# Patient Record
Sex: Female | Born: 1990 | Race: Black or African American | Hispanic: No | Marital: Single | Smoking: Never smoker
Health system: Southern US, Community
[De-identification: ages and names within clinical notes are randomized; demographics above are authoritative.]

---

## 2010-01-10 ENCOUNTER — Emergency Department (HOSPITAL_COMMUNITY): Admission: EM | Admit: 2010-01-10 | Discharge: 2010-01-10 | Payer: Self-pay | Admitting: Emergency Medicine

## 2010-06-11 ENCOUNTER — Emergency Department (HOSPITAL_COMMUNITY)
Admission: EM | Admit: 2010-06-11 | Discharge: 2010-06-12 | Disposition: A | Discharge status: Home / Self Care | Payer: Self-pay | Attending: Emergency Medicine | Admitting: Emergency Medicine

## 2010-06-11 DIAGNOSIS — N898 Other specified noninflammatory disorders of vagina: Secondary | ICD-10-CM | POA: Insufficient documentation

## 2010-06-11 DIAGNOSIS — Z202 Contact with and (suspected) exposure to infections with a predominantly sexual mode of transmission: Secondary | ICD-10-CM | POA: Insufficient documentation

## 2010-06-11 DIAGNOSIS — J45909 Unspecified asthma, uncomplicated: Secondary | ICD-10-CM | POA: Insufficient documentation

## 2010-06-12 LAB — POCT PREGNANCY, URINE: Preg Test, Ur: NEGATIVE

## 2010-06-12 LAB — WET PREP, GENITAL: Yeast Wet Prep HPF POC: NONE SEEN

## 2010-06-12 LAB — GC/CHLAMYDIA PROBE AMP, GENITAL: GC Probe Amp, Genital: NEGATIVE

## 2010-08-03 ENCOUNTER — Inpatient Hospital Stay (INDEPENDENT_AMBULATORY_CARE_PROVIDER_SITE_OTHER)
Admission: RE | Admit: 2010-08-03 | Discharge: 2010-08-03 | Disposition: A | Discharge status: Home / Self Care | Payer: Self-pay | Source: Ambulatory Visit | Attending: Emergency Medicine | Admitting: Emergency Medicine

## 2010-08-03 ENCOUNTER — Ambulatory Visit (INDEPENDENT_AMBULATORY_CARE_PROVIDER_SITE_OTHER): Payer: Self-pay

## 2010-08-03 DIAGNOSIS — IMO0002 Reserved for concepts with insufficient information to code with codable children: Secondary | ICD-10-CM

## 2010-08-03 LAB — POCT PREGNANCY, URINE: Preg Test, Ur: NEGATIVE

## 2010-10-24 ENCOUNTER — Emergency Department (HOSPITAL_COMMUNITY)
Admission: EM | Admit: 2010-10-24 | Discharge: 2010-10-24 | Disposition: A | Discharge status: Home / Self Care | Payer: Self-pay | Attending: Emergency Medicine | Admitting: Emergency Medicine

## 2010-10-24 DIAGNOSIS — N72 Inflammatory disease of cervix uteri: Secondary | ICD-10-CM | POA: Insufficient documentation

## 2010-10-24 DIAGNOSIS — J45909 Unspecified asthma, uncomplicated: Secondary | ICD-10-CM | POA: Insufficient documentation

## 2010-10-24 LAB — URINALYSIS, ROUTINE W REFLEX MICROSCOPIC
Bilirubin Urine: NEGATIVE
Ketones, ur: NEGATIVE mg/dL
Leukocytes, UA: NEGATIVE
Nitrite: NEGATIVE
Specific Gravity, Urine: 1.012 (ref 1.005–1.030)
Urobilinogen, UA: 0.2 mg/dL (ref 0.0–1.0)

## 2010-10-24 LAB — POCT PREGNANCY, URINE: Preg Test, Ur: NEGATIVE

## 2010-10-25 LAB — GC/CHLAMYDIA PROBE AMP, GENITAL: Chlamydia, DNA Probe: NEGATIVE

## 2010-11-06 ENCOUNTER — Inpatient Hospital Stay (INDEPENDENT_AMBULATORY_CARE_PROVIDER_SITE_OTHER)
Admission: RE | Admit: 2010-11-06 | Discharge: 2010-11-06 | Disposition: A | Discharge status: Home / Self Care | Payer: Self-pay | Source: Ambulatory Visit | Attending: Family Medicine | Admitting: Family Medicine

## 2010-11-06 ENCOUNTER — Ambulatory Visit (INDEPENDENT_AMBULATORY_CARE_PROVIDER_SITE_OTHER): Payer: Self-pay

## 2010-11-06 DIAGNOSIS — S20229A Contusion of unspecified back wall of thorax, initial encounter: Secondary | ICD-10-CM

## 2010-11-06 DIAGNOSIS — J45909 Unspecified asthma, uncomplicated: Secondary | ICD-10-CM

## 2010-12-21 ENCOUNTER — Emergency Department (HOSPITAL_COMMUNITY)
Admission: EM | Admit: 2010-12-21 | Discharge: 2010-12-21 | Discharge status: Against Medical Advice | Payer: Self-pay | Attending: Emergency Medicine | Admitting: Emergency Medicine

## 2010-12-21 DIAGNOSIS — L02419 Cutaneous abscess of limb, unspecified: Secondary | ICD-10-CM | POA: Insufficient documentation

## 2010-12-21 DIAGNOSIS — J45909 Unspecified asthma, uncomplicated: Secondary | ICD-10-CM | POA: Insufficient documentation

## 2012-05-01 IMAGING — CR DG CHEST 2V
2 series · 2 of 2 positions shown · non-contrast
Comparison: None.

CLINICAL DATA: Cough with shortness of breath and body aches for 3
days.

CHEST - 2 VIEW

[view not recorded (1 of 2)]
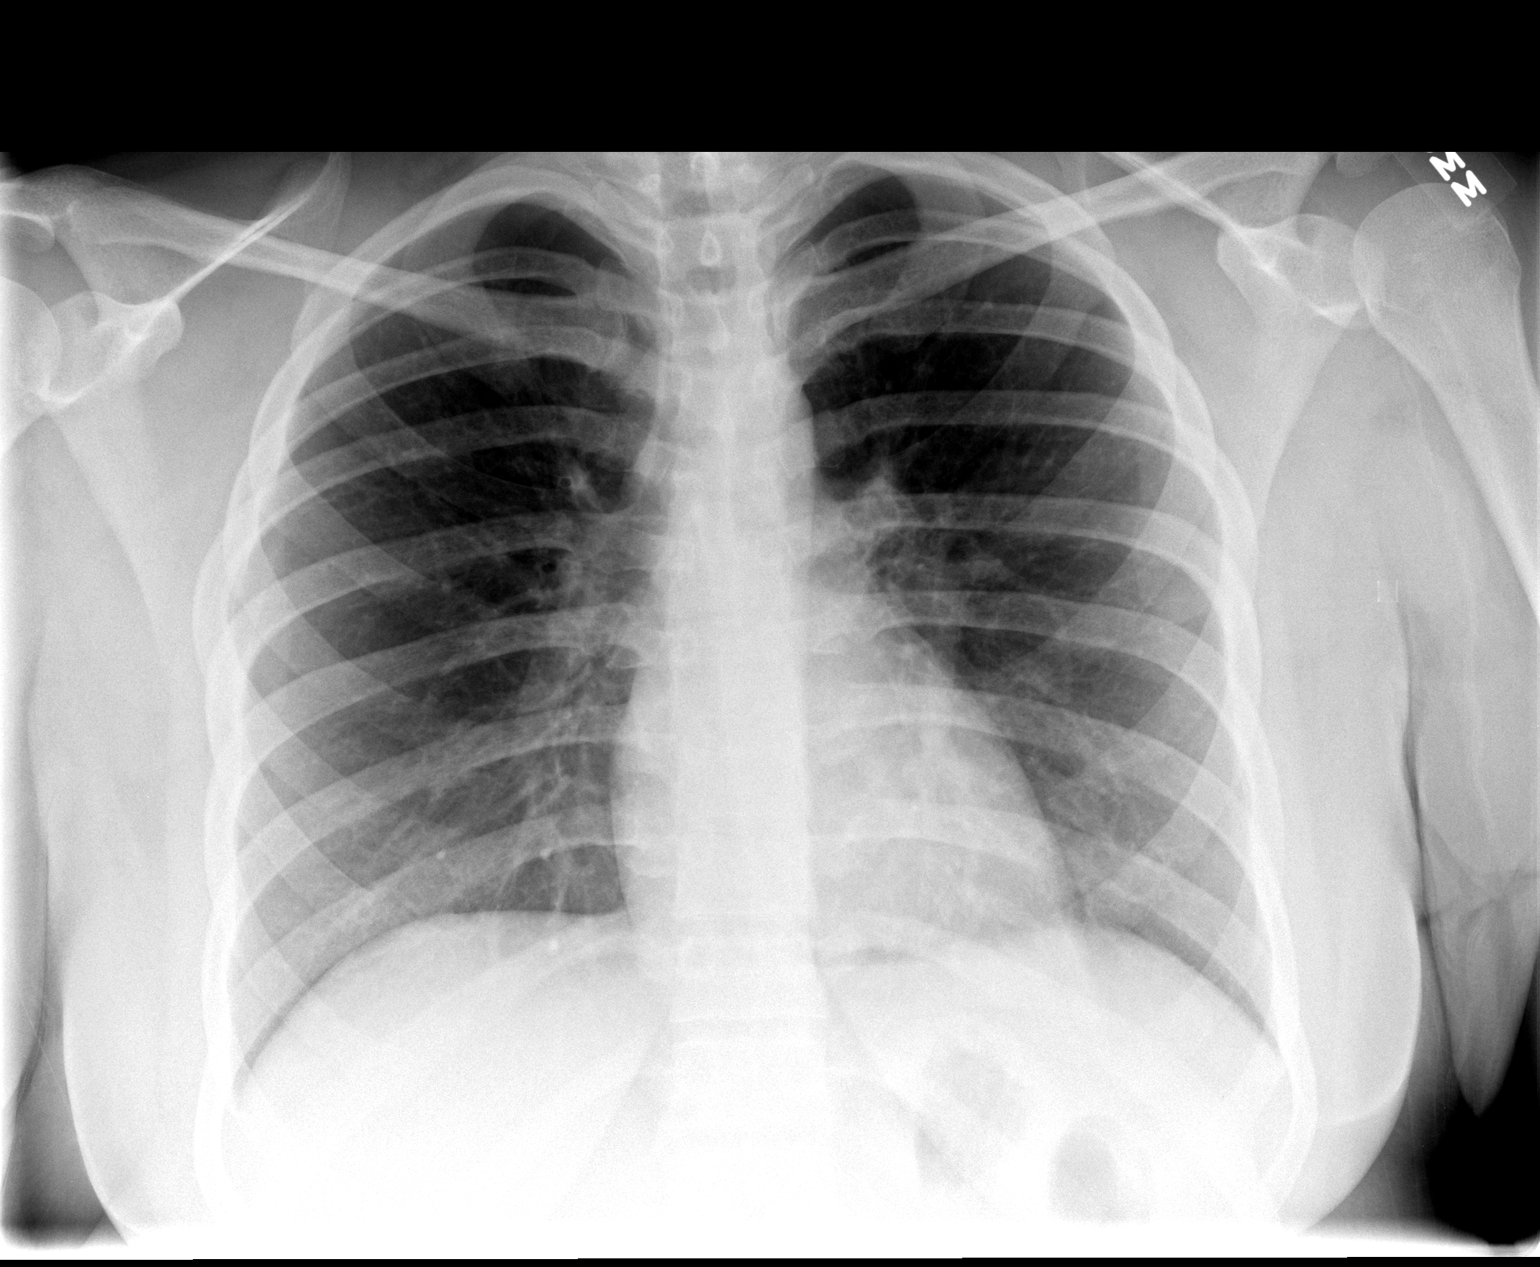

[view not recorded (2 of 2)]
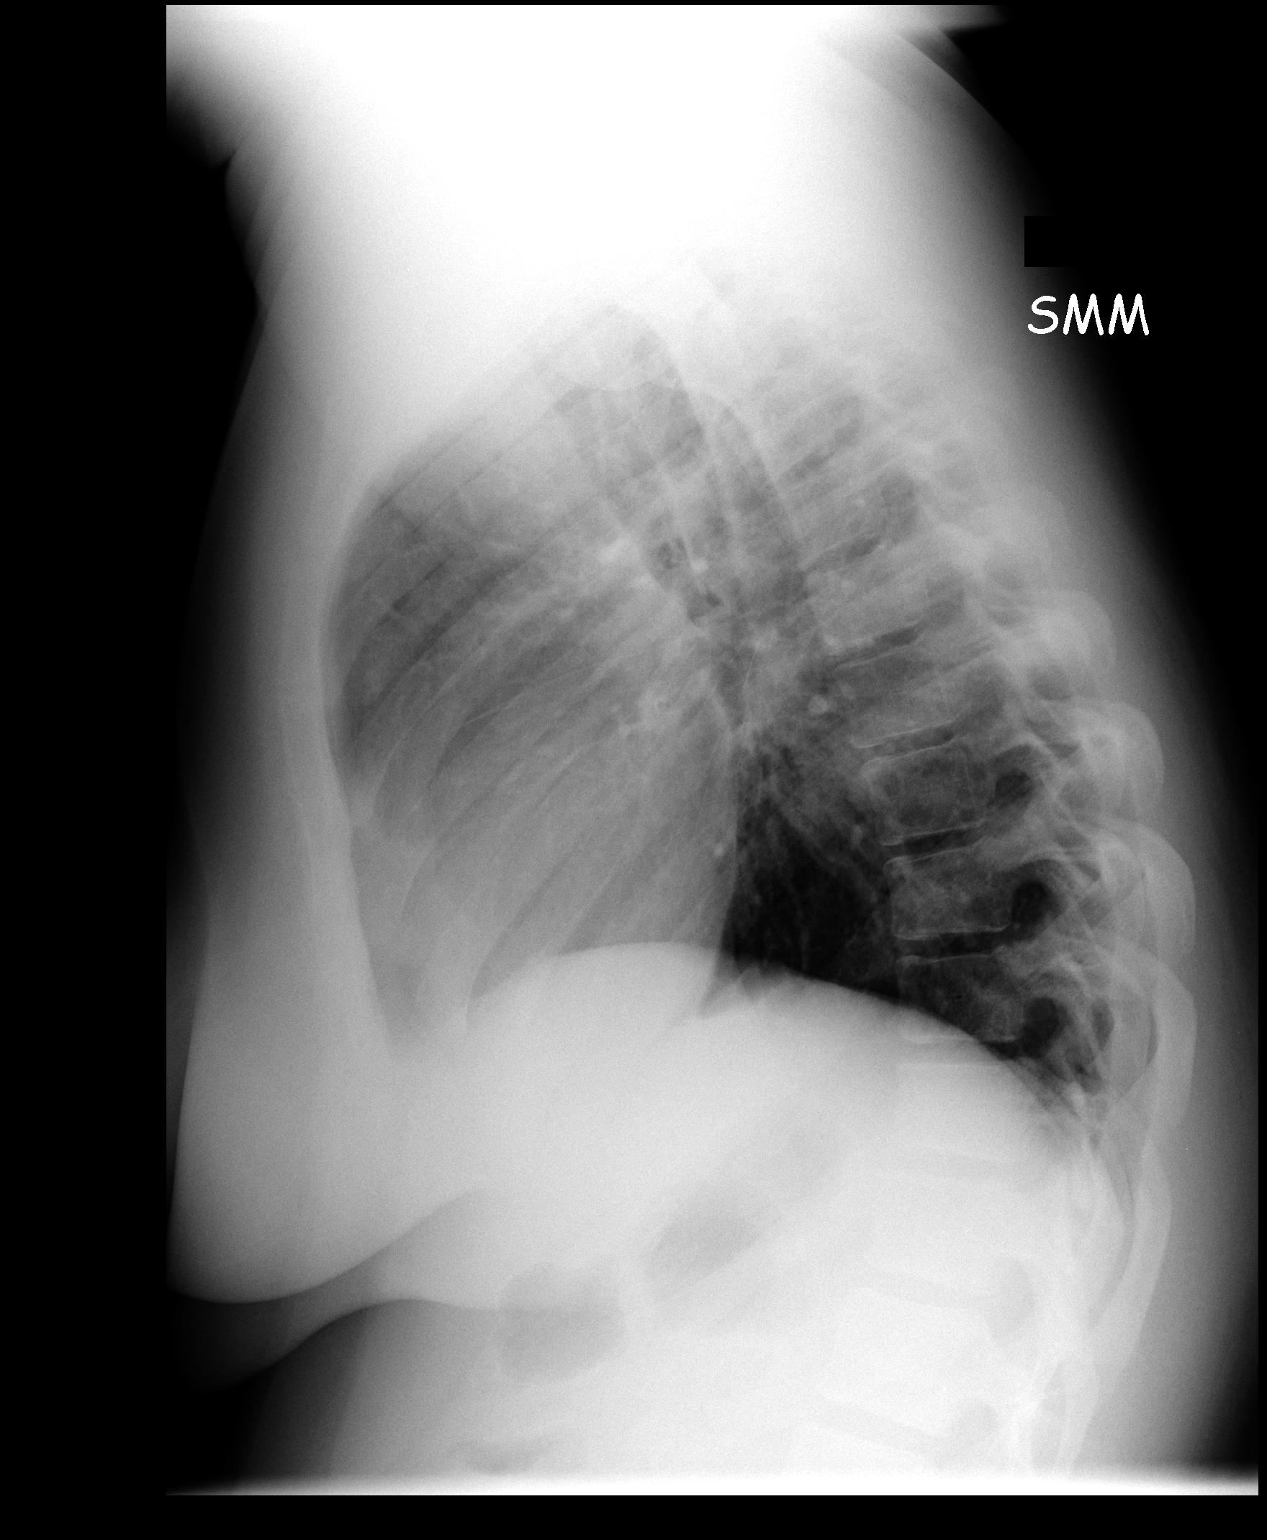

[2 of 2 positions shown; findings below may reference images not displayed]

FINDINGS: The heart size and mediastinal contours are normal. The
lungs are clear. There is no pleural effusion or pneumothorax. No
acute osseous findings are identified.
IMPRESSION: No active cardiopulmonary process.

## 2022-11-11 ENCOUNTER — Other Ambulatory Visit: Payer: Self-pay

## 2022-11-11 ENCOUNTER — Encounter (HOSPITAL_COMMUNITY): Payer: Self-pay | Admitting: Emergency Medicine

## 2022-11-11 ENCOUNTER — Emergency Department (HOSPITAL_COMMUNITY)
Admission: EM | Admit: 2022-11-11 | Discharge: 2022-11-12 | Disposition: A | Discharge status: Home / Self Care | Payer: Medicaid Other | Attending: Emergency Medicine | Admitting: Emergency Medicine

## 2022-11-11 DIAGNOSIS — R519 Headache, unspecified: Secondary | ICD-10-CM | POA: Insufficient documentation

## 2022-11-11 DIAGNOSIS — Y92410 Unspecified street and highway as the place of occurrence of the external cause: Secondary | ICD-10-CM | POA: Diagnosis not present

## 2022-11-11 DIAGNOSIS — S022XXA Fracture of nasal bones, initial encounter for closed fracture: Secondary | ICD-10-CM | POA: Insufficient documentation

## 2022-11-11 DIAGNOSIS — M542 Cervicalgia: Secondary | ICD-10-CM | POA: Diagnosis not present

## 2022-11-11 DIAGNOSIS — S0992XA Unspecified injury of nose, initial encounter: Secondary | ICD-10-CM | POA: Diagnosis present
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
## 2022-11-12 ENCOUNTER — Emergency Department (HOSPITAL_COMMUNITY): Payer: Medicaid Other

## 2022-11-12 MED ORDER — DIPHENHYDRAMINE HCL 25 MG PO CAPS
25.0000 mg | ORAL_CAPSULE | Freq: Once | ORAL | Status: AC
  Administered 2022-11-12: 25 mg via ORAL
  Filled 2022-11-12: qty 1

## 2022-11-12 MED ORDER — ACETAMINOPHEN 500 MG PO TABS
1000.0000 mg | ORAL_TABLET | Freq: Four times a day (QID) | ORAL | Status: DC | PRN
  Administered 2022-11-12: 1000 mg via ORAL
  Filled 2022-11-12: qty 2

## 2022-11-12 MED ORDER — PROCHLORPERAZINE MALEATE 5 MG PO TABS
10.0000 mg | ORAL_TABLET | Freq: Once | ORAL | Status: AC
  Administered 2022-11-12: 10 mg via ORAL
  Filled 2022-11-12: qty 2

## 2022-11-12 MED ORDER — ONDANSETRON 4 MG PO TBDP
4.0000 mg | ORAL_TABLET | Freq: Once | ORAL | Status: AC
  Administered 2022-11-12: 4 mg via ORAL
  Filled 2022-11-12: qty 1

## 2022-11-12 NOTE — ED Notes (Signed)
Unable to locate patient at ER .
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
## 2022-11-12 NOTE — Discharge Instructions (Addendum)
Imaging was reassuring, it is possible that you have a slight concussion, symptoms include a mild headache, brain fog, dizziness, lightheadedness, difficulty concentrating, increase sensitivity to light or noise.  The symptoms will resolve on their own I recommend brain rest i.e. decreasing screen time, vigorous activities and slowly reintroduce them as tolerated.  If your symptoms persist over the weeks time please follow-up with your PCP and/or the concussion clinic for further evaluation you may take over-the-counter pain medication as needed. Nasal fracture-please follow-up with ENT for further assessment.   Come back to the emergency department if you develop chest pain, shortness of breath, severe abdominal pain, uncontrolled nausea, vomiting, diarrhea.

## 2022-11-19 ENCOUNTER — Ambulatory Visit (INDEPENDENT_AMBULATORY_CARE_PROVIDER_SITE_OTHER): Payer: Medicaid Other | Admitting: Otolaryngology
